# Patient Record
Sex: Female | Born: 1990 | Race: Black or African American | Hispanic: No | Marital: Single | Smoking: Never smoker
Health system: Southern US, Community
[De-identification: ages and names within clinical notes are randomized; demographics above are authoritative.]

## PROBLEM LIST (undated history)

## (undated) HISTORY — PX: TUBAL LIGATION: SHX77

---

## 2018-08-21 NOTE — Congregational Nurse Program (Signed)
Congregational Nurse Program Note  Date of Encounter: 08/21/2018  Past Medical History: No past medical history on file.  Encounter Details: [facility] No complaints. B P  113/[address] P [address] [place], [place] [phone] [facility]

## 2018-12-16 ENCOUNTER — Emergency Department (HOSPITAL_COMMUNITY): Payer: 59

## 2018-12-16 ENCOUNTER — Other Ambulatory Visit: Payer: Self-pay

## 2018-12-16 ENCOUNTER — Emergency Department (HOSPITAL_COMMUNITY)
Admission: EM | Admit: 2018-12-16 | Discharge: 2018-12-16 | Disposition: A | Discharge status: Home / Self Care | Payer: 59 | Attending: Emergency Medicine | Admitting: Emergency Medicine

## 2018-12-16 ENCOUNTER — Encounter (HOSPITAL_COMMUNITY): Payer: Self-pay

## 2018-12-16 DIAGNOSIS — J189 Pneumonia, unspecified organism: Secondary | ICD-10-CM | POA: Diagnosis not present

## 2018-12-16 DIAGNOSIS — J181 Lobar pneumonia, unspecified organism: Secondary | ICD-10-CM

## 2018-12-16 DIAGNOSIS — R05 Cough: Secondary | ICD-10-CM | POA: Diagnosis present

## 2018-12-16 MED ORDER — CEPHALEXIN 500 MG PO CAPS: 500.0000 mg | ORAL_CAPSULE | Freq: Four times a day (QID) | ORAL | 0 refills | Status: DC

## 2018-12-16 MED ORDER — IBUPROFEN 800 MG PO TABS
800.0000 mg | ORAL_TABLET | Freq: Once | ORAL | Status: AC
  Administered 2018-12-16: 800 mg via ORAL
  Filled 2018-12-16: qty 1

## 2018-12-16 MED ORDER — ONDANSETRON HCL 4 MG PO TABS
4.0000 mg | ORAL_TABLET | Freq: Once | ORAL | Status: AC
  Administered 2018-12-16: 4 mg via ORAL
  Filled 2018-12-16: qty 1

## 2018-12-16 MED ORDER — HYDROCOD POLST-CPM POLST ER 10-8 MG/5ML PO SUER
5.0000 mL | Freq: Once | ORAL | Status: AC
  Administered 2018-12-16: 5 mL via ORAL
  Filled 2018-12-16: qty 5

## 2018-12-16 MED ORDER — ACETAMINOPHEN 325 MG PO TABS
650.0000 mg | ORAL_TABLET | Freq: Once | ORAL | Status: AC
  Administered 2018-12-16: 650 mg via ORAL

## 2018-12-16 MED ORDER — CEPHALEXIN 500 MG PO CAPS
500.0000 mg | ORAL_CAPSULE | Freq: Once | ORAL | Status: AC
  Administered 2018-12-16: 500 mg via ORAL
  Filled 2018-12-16: qty 1

## 2018-12-16 MED ORDER — HYDROCODONE-HOMATROPINE 5-1.5 MG/5ML PO SYRP: 5.0000 mL | ORAL_SOLUTION | Freq: Four times a day (QID) | ORAL | 0 refills | Status: DC | PRN

## 2018-12-16 MED ORDER — ALBUTEROL SULFATE HFA 108 (90 BASE) MCG/ACT IN AERS
2.0000 | INHALATION_SPRAY | Freq: Once | RESPIRATORY_TRACT | Status: AC
  Administered 2018-12-16: 2 via RESPIRATORY_TRACT
  Filled 2018-12-16 (×2): qty 6.7

## 2018-12-16 MED ORDER — AZITHROMYCIN 250 MG PO TABS
500.0000 mg | ORAL_TABLET | Freq: Once | ORAL | Status: AC
  Administered 2018-12-16: 500 mg via ORAL
  Filled 2018-12-16: qty 2

## 2018-12-16 MED ORDER — AZITHROMYCIN 250 MG PO TABS: ORAL_TABLET | ORAL | 0 refills | Status: DC

## 2018-12-16 MED ORDER — ACETAMINOPHEN 325 MG PO TABS
ORAL_TABLET | ORAL | Status: AC
  Filled 2018-12-16: qty 2

## 2018-12-16 NOTE — ED Triage Notes (Signed)
Pt c/o cough, congestion, headache, chills, headache since Friday.  Has been taking tylenol pm prn and alkaseltzer plus.

## 2018-12-16 NOTE — ED Provider Notes (Signed)
[facility]ENCY [facility] Provider Note   CSN: [id number] Arrival date & time: 12/16/18  1805     History   Chief Complaint Chief Complaint  Patient presents with  . Cough  . Chills    HPI Donna HeinzVeronica Horton is a 27 y.o. female.  The history is provided by the patient.  Cough  This is a new problem. The current episode started more than 2 days ago. The problem occurs constantly. The problem has been gradually worsening. The cough is productive of sputum. The maximum temperature recorded prior to her arrival was 101 to 101.9 F. The fever has been present for 1 to 2 days. Associated symptoms include chills, myalgias and shortness of breath. Pertinent negatives include no chest pain and no wheezing. Treatments tried: alka-seltzer. The treatment provided no relief. Her past medical history is significant for bronchitis.    History reviewed. No pertinent past medical history.  There are no active problems to display for this patient.   History reviewed. No pertinent surgical history.   OB History   No obstetric history on file.      Home Medications    Prior to Admission medications   Not on File    Family History No family history on file.  Social History Social History   Tobacco Use  . Smoking status: Never Smoker  . Smokeless tobacco: Never Used  Substance Use Topics  . Alcohol use: Never    Frequency: Never  . Drug use: Never     Allergies   Patient has no known allergies.   Review of Systems Review of Systems  Constitutional: Positive for chills. Negative for activity change.       All ROS Neg except as noted in HPI  HENT: Positive for congestion. Negative for nosebleeds.   Eyes: Negative for photophobia and discharge.  Respiratory: Positive for cough and shortness of breath. Negative for wheezing.   Cardiovascular: Negative for chest pain and palpitations.  Gastrointestinal: Negative for abdominal pain and blood in stool.  Genitourinary:  Negative for dysuria, frequency and hematuria.  Musculoskeletal: Positive for myalgias. Negative for arthralgias, back pain and neck pain.  Skin: Negative.   Neurological: Negative for dizziness, seizures and speech difficulty.  Psychiatric/Behavioral: Negative for confusion and hallucinations.     Physical Exam Updated Vital Signs BP 131/77 (BP Location: Right Arm)   Pulse (!) 132   Temp (!) 101.9 F (38.8 C) (Oral)   Resp 18   Ht 5\' 2"  (1.575 m)   Wt 99.8 kg   LMP 11/28/2018   SpO2 94%   BMI 40.24 kg/m   Physical Exam Vitals signs and nursing note reviewed.  Constitutional:      Appearance: She is well-developed. She is not toxic-appearing.  HENT:     Head: Normocephalic.     Right Ear: Tympanic membrane and external ear normal.     Left Ear: Tympanic membrane and external ear normal.     Nose: Congestion present.  Eyes:     General: Lids are normal.     Pupils: Pupils are equal, round, and reactive to light.  Neck:     Musculoskeletal: Normal range of motion and neck supple.     Vascular: No carotid bruit.  Cardiovascular:     Rate and Rhythm: Normal rate and regular rhythm.     Pulses: Normal pulses.     Heart sounds: Normal heart sounds.  Pulmonary:     Effort: No respiratory distress.     Breath  sounds: Rhonchi present.  Abdominal:     General: Bowel sounds are normal.     Palpations: Abdomen is soft.     Tenderness: There is no abdominal tenderness. There is no guarding.  Musculoskeletal: Normal range of motion.  Lymphadenopathy:     Head:     Right side of head: No submandibular adenopathy.     Left side of head: No submandibular adenopathy.     Cervical: No cervical adenopathy.  Skin:    General: Skin is warm and dry.  Neurological:     Mental Status: She is alert and oriented to person, place, and time.     Cranial Nerves: No cranial nerve deficit.     Sensory: No sensory deficit.  Psychiatric:        Speech: Speech normal.      ED  Treatments / Results  Labs (all labs ordered are listed, but only abnormal results are displayed) Labs Reviewed - No data to display  EKG None  Radiology Dg Chest 2 View  Result Date: 12/16/2018 CLINICAL DATA:  Cough and fever. EXAM: CHEST - 2 VIEW COMPARISON:  None FINDINGS: There is a consolidative infiltrate in the right upper lobe adjacent to the major fissure. The lungs are otherwise clear. No effusions. Heart size and vascularity are normal. Bones are normal. IMPRESSION: Right upper lobe pneumonia. Electronically Signed   By: Francene BoyersJames  Maxwell M.D.   On: 12/16/2018 19:01    Procedures Procedures (including critical care time)  Medications Ordered in ED Medications  acetaminophen (TYLENOL) 325 MG tablet (has no administration in time range)  acetaminophen (TYLENOL) tablet 650 mg (650 mg Oral Given 12/16/18 1815)     Initial Impression / Assessment and Plan / ED Course  I have reviewed the triage vital signs and the nursing notes.  Pertinent labs & imaging results that were available during my care of the patient were reviewed by me and considered in my medical decision making (see chart for details).       Final Clinical Impressions(s) / ED Diagnoses MDM  Temperature elevated at 101.9 on admission to the emergency department.  Patient presents with about 3 days of cough, congestion, headache and body aches.  Chest x-ray shows a right upper lobe pneumonia.  The patient was treated in the emergency department with Zithromax and Rocephin.  Patient was also given an albuterol inhaler to use.  Patient advised to increase fluids, use her mask until symptoms have resolved.  She will use Tylenol extra strength for fever and aching.  Patient also given medication for cough.  Patient will use Zithromax daily.  I have asked the patient to see her primary physician or return to the emergency department for recheck.  The patient is in agreement with this plan.   Final diagnoses:    Community acquired pneumonia of right upper lobe of lung [facility](HCC)    ED Discharge Orders         Ordered    azithromycin (ZITHROMAX) 250 MG tablet     12/16/18 2020    HYDROcodone-homatropine (HYCODAN) 5-1.5 MG/5ML syrup  Every 6 hours PRN     12/16/18 2020    cephALEXin (KEFLEX) 500 MG capsule  4 times daily     12/16/18 2020           Ivery QualeBryant, Avyonna Wagoner, PA-C 12/17/18 Elberta [id number]    Kohut, Stephen, MD 12/19/18 [id number]

## 2018-12-16 NOTE — Discharge Instructions (Signed)
Your x-ray is consistent with a right lung pneumonia.  Please use Tylenol every 4 hours, and/or ibuprofen every 6 hours to maintain a stable temperature.  Please increase fluids including water, juices, Kool-Aid, popsicles, etc.  It is important that you use your mask until symptoms have resolved.  Please have everyone in the home wash hands frequently.  Please use 2 puffs of albuterol every 4 hours.  Use Keflex and ibuprofen 600mg  with breakfast, lunch, dinner, and at bedtime.  Use Zithromax 1 tablet daily.  Use Hycodan every 6 hours as needed for cough. Please see your primary physician in about 4 to 6 days for follow-up and recheck.  Return to the emergency department if any changes in your condition, worsening of your condition, problems, or concerns.

## 2019-01-28 MED ORDER — LIDOCAINE HCL (PF) 1 % IJ SOLN
INTRAMUSCULAR | Status: AC
  Filled 2019-01-28: qty 5

## 2019-01-28 MED ORDER — LIDOCAINE-EPINEPHRINE (PF) 1 %-1:200000 IJ SOLN
INTRAMUSCULAR | Status: AC
  Filled 2019-01-28: qty 30

## 2019-01-28 MED ORDER — SODIUM BICARBONATE 4 % IV SOLN
INTRAVENOUS | Status: AC
  Filled 2019-01-28: qty 5

## 2020-04-27 IMAGING — DX DG CHEST 2V
2 series · 2 of 2 positions shown · non-contrast
Comparison: None

CLINICAL DATA: Cough and fever.

EXAM:
CHEST - 2 VIEW

[chest pa]
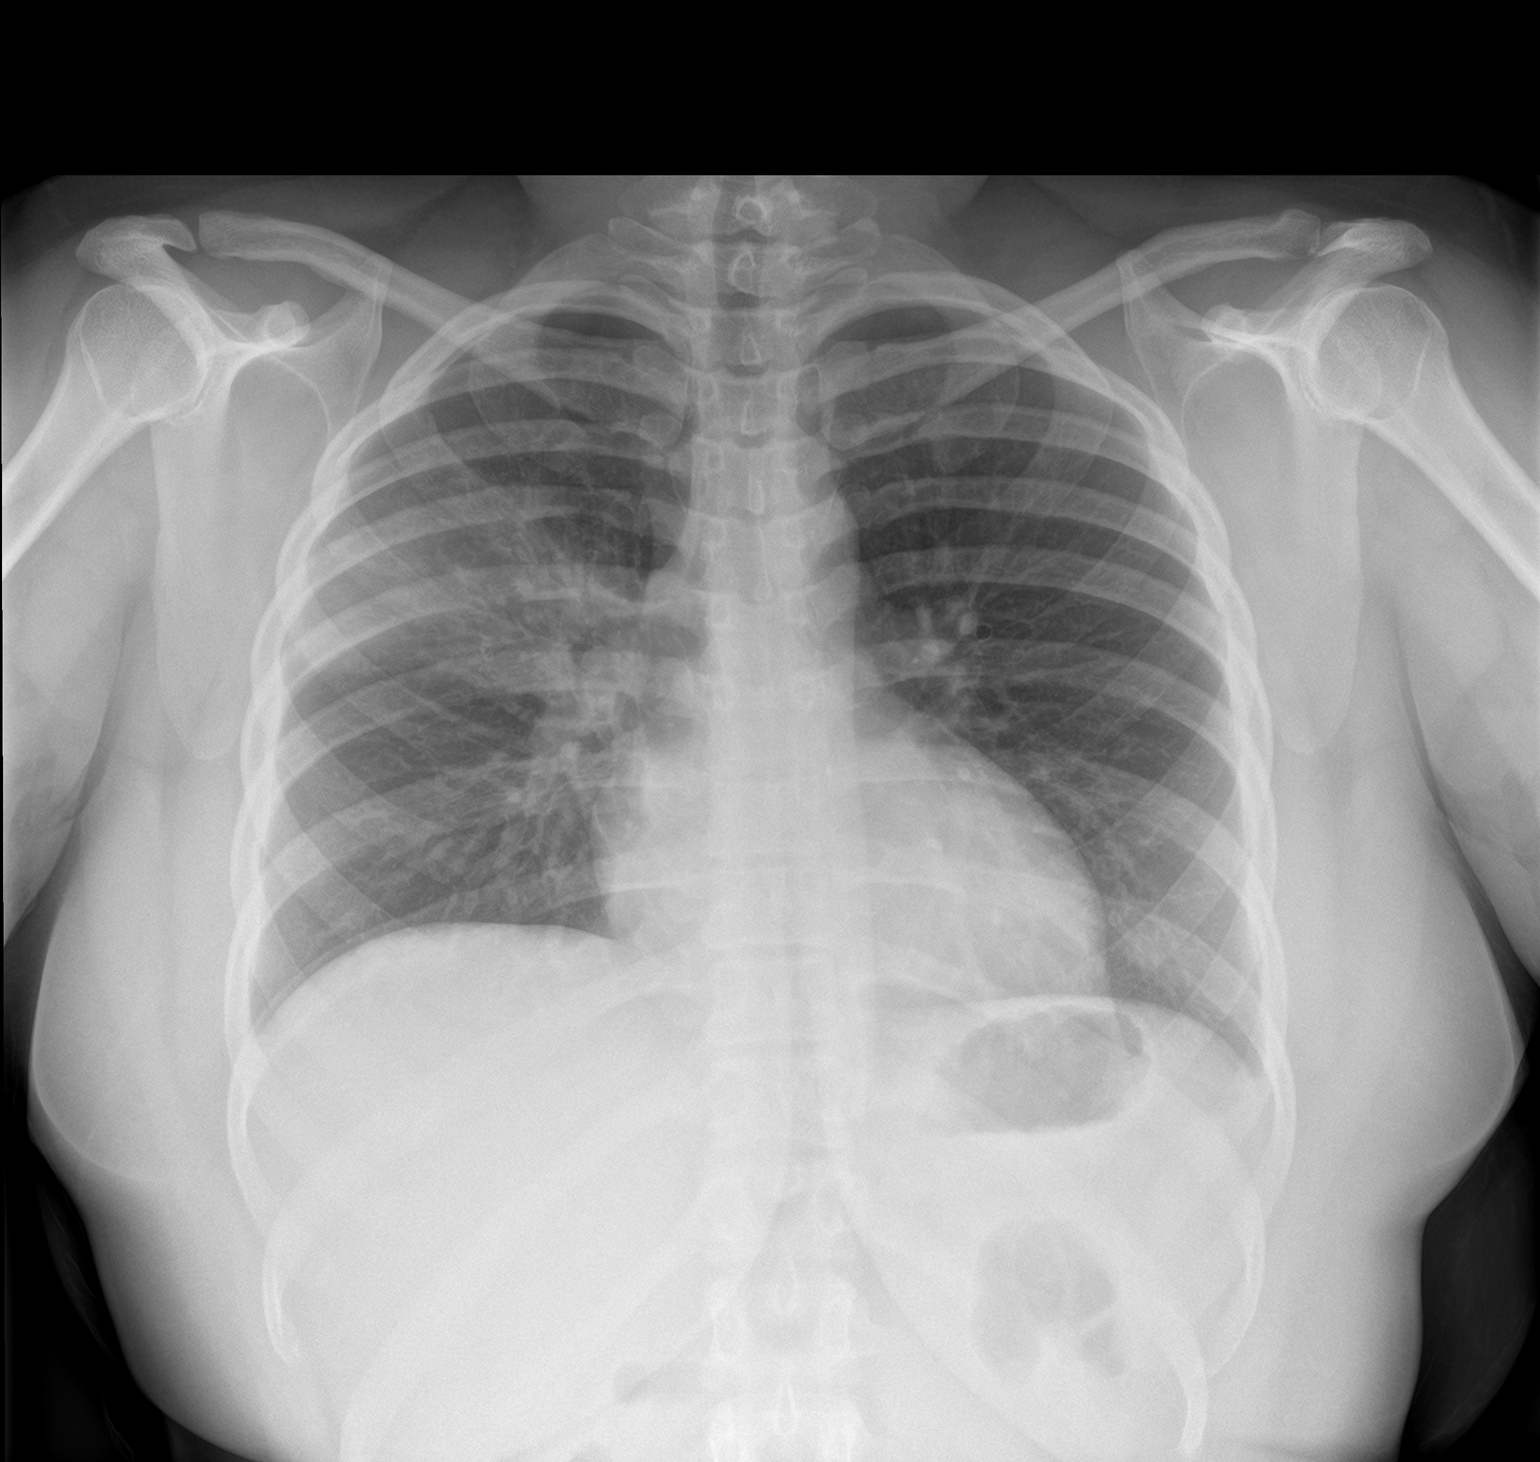

[chest lat]
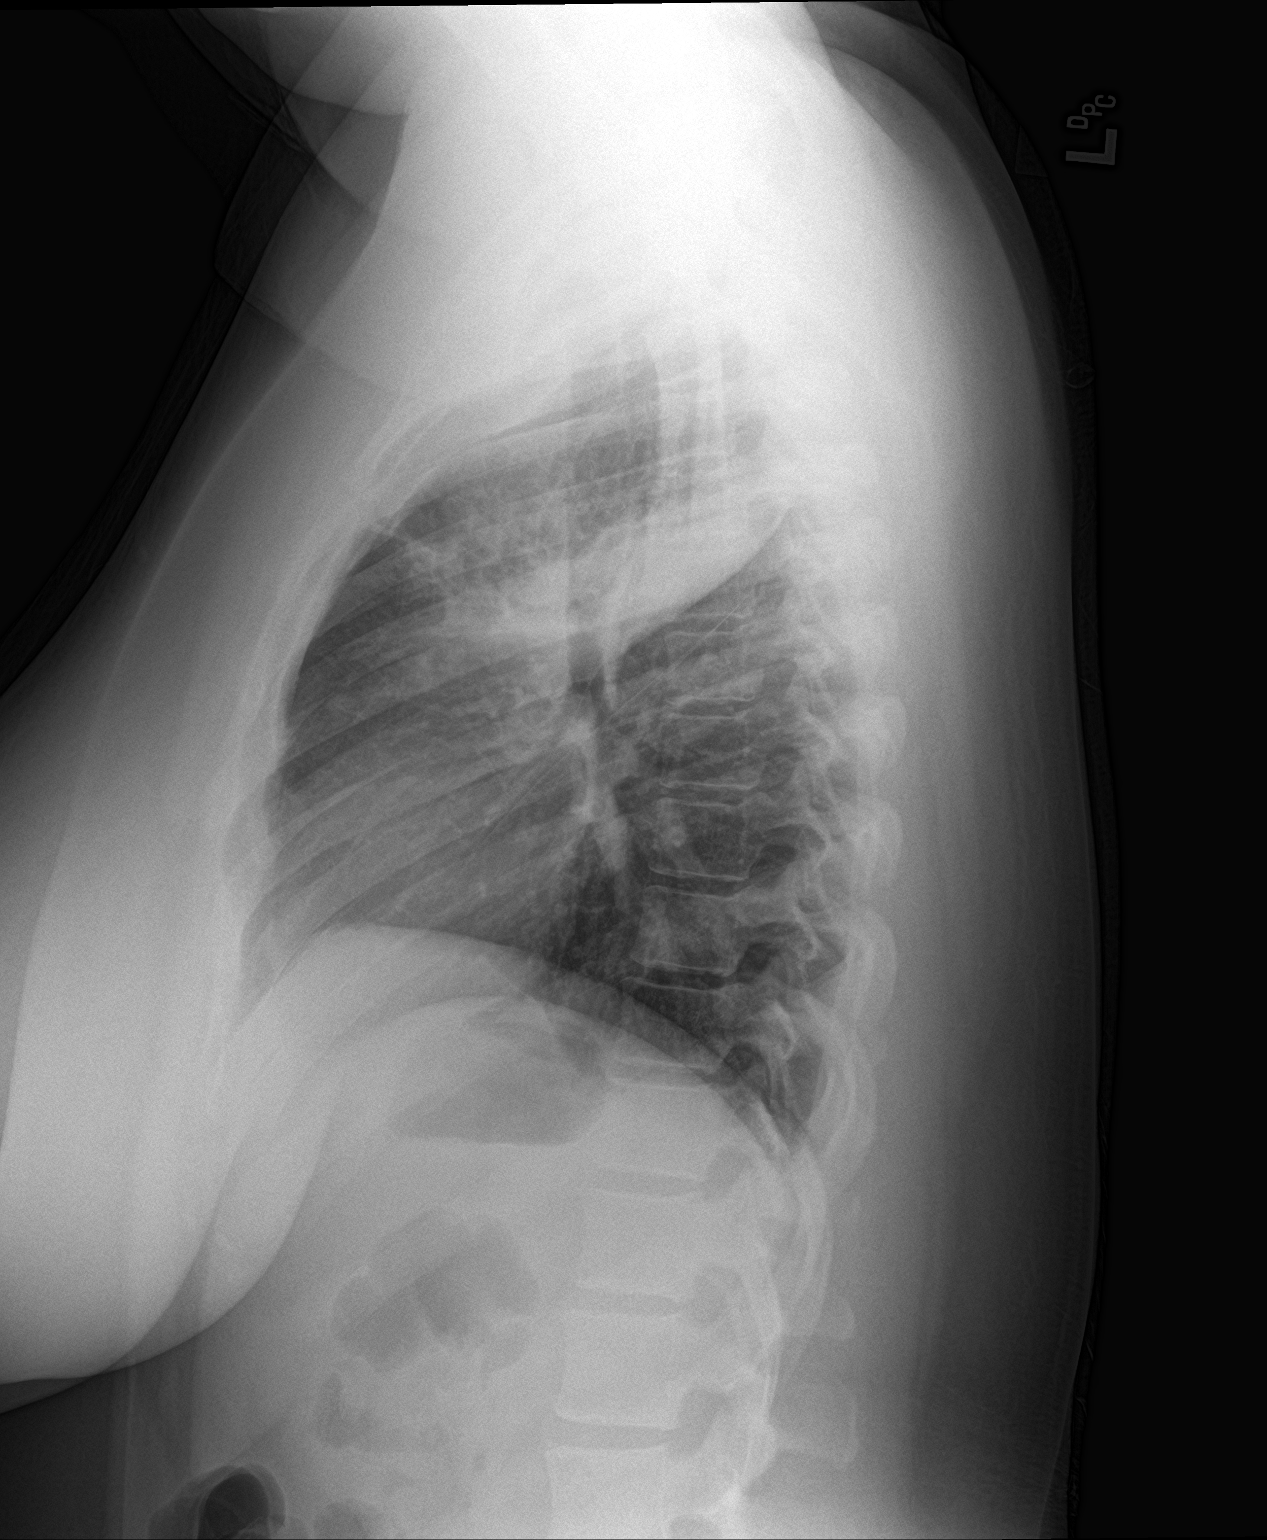

[2 of 2 positions shown; findings below may reference images not displayed]

FINDINGS: There is a consolidative infiltrate in the right upper lobe adjacent
to the major fissure. The lungs are otherwise clear. No effusions.
Heart size and vascularity are normal. Bones are normal.
IMPRESSION: Right upper lobe pneumonia.

## 2020-06-14 ENCOUNTER — Encounter (HOSPITAL_COMMUNITY): Payer: Self-pay | Admitting: *Deleted

## 2020-06-14 ENCOUNTER — Other Ambulatory Visit: Payer: Self-pay

## 2020-06-14 ENCOUNTER — Emergency Department (HOSPITAL_COMMUNITY)
Admission: EM | Admit: 2020-06-14 | Discharge: 2020-06-14 | Disposition: A | Discharge status: Home / Self Care | Payer: Medicaid Other | Attending: Emergency Medicine | Admitting: Emergency Medicine

## 2020-06-14 DIAGNOSIS — K047 Periapical abscess without sinus: Secondary | ICD-10-CM | POA: Diagnosis not present

## 2020-06-14 MED ORDER — CLINDAMYCIN HCL 150 MG PO CAPS: 450.0000 mg | ORAL_CAPSULE | Freq: Once | ORAL | Status: DC

## 2020-06-14 MED ORDER — CLINDAMYCIN HCL 150 MG PO CAPS
300.0000 mg | ORAL_CAPSULE | Freq: Once | ORAL | Status: AC
  Administered 2020-06-14: 300 mg via ORAL
  Filled 2020-06-14: qty 2

## 2020-06-14 MED ORDER — CLINDAMYCIN HCL 150 MG PO CAPS: 300.0000 mg | ORAL_CAPSULE | Freq: Four times a day (QID) | ORAL | 0 refills | Status: AC

## 2020-06-14 NOTE — ED Triage Notes (Signed)
Pt has pain and swelling to right lower jaw; pt states she went to her dentist x 2 weeks and was told she needed to have a tooth pulled, they made the appointment for the end of July and gave the patient an antibiotic and ibuprofen; pt states she woke up last Wednesday with jaw swelling that has gotten progressively worse; pt states she finished her antibiotic yesterday; pt called her dentist this am and they instructed her to come to the ED

## 2020-06-14 NOTE — Discharge Instructions (Addendum)
You may follow up with Dr. Sonda Rumble: [phone]  [address] [place], [place] [address]  Return to the ER if your symptoms not improve in 3 to 4 days, worsening fevers, chills, drooling, difficulty swallowing, etc.

## 2020-06-14 NOTE — ED Provider Notes (Signed)
Patient with progressive right lower jaw swelling, this is on the buccal surface, there is no sublingual swelling, no signs of trismus torticollis or Ludwick's angina, no fever, vital signs are unremarkable except for mild tachycardia of 104.  Patient advised of the indications for return, will be started on clindamycin, has follow-up in over 3 weeks at a dentist office, will need closer follow-up, will give alternative local dentistry contact information.  Patient agreeable  Medical screening examination/treatment/procedure(s) were conducted as a shared visit with non-physician practitioner(s) and myself.  I personally evaluated the patient during the encounter.  Clinical Impression:   Final diagnoses:  Dental abscess         Eber Hong, MD 06/15/20 [phone]

## 2020-06-14 NOTE — ED Provider Notes (Signed)
[facility] EMERGENCY DEPARTMENT Provider Note   CSN: [id number] Arrival date & time: 06/14/20  1305     History Chief Complaint  Patient presents with  . Oral Swelling    Donna Horton is a 29 y.o. female.  HPI 29 year old female with no significant medical history presents to the ER with right cheek/jaw swelling since Wednesday.  Patient states that she was seen by a dentist approximately 2 weeks ago, and was told that she needs to have a tooth pulled, was scheduled for tooth extraction at the end of July.  She was started on a 2-week course of amoxicillin.  Patient reports compliance with this, however on Wednesday, 06/09/2020 she started noticing swelling to her right lower cheek.  She denies any pain, difficulty swallowing, trismus, drooling, fevers or chills, voice change.  Swelling has progressively gotten worse.  She states this is not painful.  She was also given chlorhexidine mouthwash and ibuprofen which she has also been compliant with.  She has been tolerating p.o. well, no nausea or vomiting.  She called her dentist, and states that they told her to come to the ER.    History reviewed. No pertinent past medical history.  There are no problems to display for this patient.   History reviewed. No pertinent surgical history.   OB History   No obstetric history on file.     History reviewed. No pertinent family history.  Social History   Tobacco Use  . Smoking status: Never Smoker  . Smokeless tobacco: Never Used  Vaping Use  . Vaping Use: Never used  Substance Use Topics  . Alcohol use: Never  . Drug use: Never    Home Medications Prior to Admission medications   Medication Sig Start Date End Date Taking? Authorizing Provider  azithromycin (ZITHROMAX) 250 MG tablet 1 po daily 12/16/18   Ivery Quale, PA-C  cephALEXin (KEFLEX) 500 MG capsule Take 1 capsule (500 mg total) by mouth 4 (four) times daily. 12/16/18   Ivery Quale, PA-C  clindamycin  (CLEOCIN) 150 MG capsule Take 2 capsules (300 mg total) by mouth 4 (four) times daily for 10 days. 06/14/20 06/24/20  Mare Ferrari, PA-C  HYDROcodone-homatropine (HYCODAN) 5-1.5 MG/5ML syrup Take 5 mLs by mouth every 6 (six) hours as needed. 12/16/18   Ivery Quale, PA-C    Allergies    Patient has no known allergies.  Review of Systems   Review of Systems  Constitutional: Negative for chills and fever.  HENT: Positive for dental problem, facial swelling and mouth sores. Negative for drooling, ear pain, rhinorrhea, sinus pressure, tinnitus, trouble swallowing and voice change.   Eyes: Negative for pain and discharge.  Respiratory: Negative for shortness of breath.   Cardiovascular: Negative for chest pain.  Gastrointestinal: Negative for abdominal pain, nausea and vomiting.    Physical Exam Updated Vital Signs BP 116/77   Pulse 95   Temp 98.9 F (37.2 C) (Oral)   Resp 18   Ht 5\' 2"  (1.575 m)   Wt 103 kg   LMP 06/11/2020   SpO2 99%   BMI 41.52 kg/m   Physical Exam Vitals and nursing note reviewed.  Constitutional:      General: She is not in acute distress.    Appearance: Normal appearance. She is well-developed. She is not ill-appearing or toxic-appearing.  HENT:     Head: Normocephalic and atraumatic.     Nose: Nose normal.     Mouth/Throat:     Mouth: Mucous membranes are  moist.     Pharynx: Oropharynx is clear. No oropharyngeal exudate or posterior oropharyngeal erythema.     Comments: Oropharynx non erythematous without exudates, uvula midline, no unilateral tonsillar swelling, tongue normal size and midline, no sublingual/submandibular swellimg, tolerating secretions well.  Moderate swelling to the right lower cheek/ jaw.  No significant palpable fluctuance, no evidence of drainage.  Visible right lower molar with crown, with visible dental caries.  1 small sore on the lower gumline on the right side.  No trismus.    Eyes:     Conjunctiva/sclera: Conjunctivae  normal.     Pupils: Pupils are equal, round, and reactive to light.  Cardiovascular:     Rate and Rhythm: Normal rate and regular rhythm.     Pulses: Normal pulses.     Heart sounds: Normal heart sounds. No murmur heard.   Pulmonary:     Effort: Pulmonary effort is normal. No respiratory distress.     Breath sounds: Normal breath sounds.  Abdominal:     General: Abdomen is flat.     Palpations: Abdomen is soft.     Tenderness: There is no abdominal tenderness.  Musculoskeletal:        General: No tenderness, deformity or signs of injury.     Cervical back: Normal range of motion and neck supple. No tenderness.  Skin:    General: Skin is warm and dry.  Neurological:     General: No focal deficit present.     Mental Status: She is alert and oriented to person, place, and time.  Psychiatric:        Mood and Affect: Mood normal.        Behavior: Behavior normal.     ED Results / Procedures / Treatments   Labs (all labs ordered are listed, but only abnormal results are displayed) Labs Reviewed - No data to display  EKG None  Radiology No results found.  Procedures Procedures (including critical care time)  Medications Ordered in ED Medications  clindamycin (CLEOCIN) capsule 300 mg (300 mg Oral Given 06/14/20 1547)    ED Course  I have reviewed the triage vital signs and the nursing notes.  Pertinent labs & imaging results that were available during my care of the patient were reviewed by me and considered in my medical decision making (see chart for details).    MDM Rules/Calculators/A&P                          Patient with swelling to the right lower jaw since Wednesday after being put on amoxicillin by her dentist.  On presentation, she is overall well-appearing, afebrile, tolerating secretions well, nontoxic-appearing.  Vitals overall reassuring.  Physical exam with evidence of swollen right lower cheek/jaw.  No evidence of Ludwig's, peritonsillar abscess,  retropharyngeal abscess, strep.  Abscess not overly fluctuant, no indication for drainage.  Will switch amoxicillin to clindamycin, very strict return precautions given to the patient.  Patient was seen and evaluated by Dr. Sabra Heck who instructed the patient to return to the ER if her symptoms not improve in 3 to 4 days.  Encouraged follow-up with Dr. Rayetta Pigg for a sooner appointment.  Patient voices understanding is agreeable to this plan.  At this stage in the ED course, the patient is medically screened and is stable for discharge.  Final Clinical Impression(s) / ED Diagnoses Final diagnoses:  Dental abscess    Rx / DC Orders ED Discharge Orders  Ordered    clindamycin (CLEOCIN) 150 MG capsule  4 times daily     Discontinue  Reprint     06/14/20 1525           Leone Brand 06/14/20 1557    Eber Hong, MD 06/15/20 [phone]

## 2021-01-05 ENCOUNTER — Other Ambulatory Visit: Payer: Self-pay

## 2021-01-05 ENCOUNTER — Emergency Department (HOSPITAL_COMMUNITY)
Admission: EM | Admit: 2021-01-05 | Discharge: 2021-01-05 | Disposition: A | Discharge status: Home / Self Care | Payer: Medicaid Other | Attending: Emergency Medicine | Admitting: Emergency Medicine

## 2021-01-05 ENCOUNTER — Encounter (HOSPITAL_COMMUNITY): Payer: Self-pay

## 2021-01-05 DIAGNOSIS — U071 COVID-19: Secondary | ICD-10-CM | POA: Insufficient documentation

## 2021-01-05 DIAGNOSIS — Z20822 Contact with and (suspected) exposure to covid-19: Secondary | ICD-10-CM

## 2021-01-05 DIAGNOSIS — J069 Acute upper respiratory infection, unspecified: Secondary | ICD-10-CM

## 2021-01-05 DIAGNOSIS — R519 Headache, unspecified: Secondary | ICD-10-CM | POA: Diagnosis present

## 2021-01-05 LAB — SARS CORONAVIRUS 2 (TAT 6-24 HRS): SARS Coronavirus 2: POSITIVE — AB

## 2021-01-05 NOTE — Discharge Instructions (Addendum)
You were tested for COVID-19 today, make sure to quarantine until you receive the results.  You may follow-up on these results via the MyChart app, there is instructions on your discharge paperwork on how to download this.  If this is positive, please make sure to quarantine additional 7 to 10 days. You may take ibuprofen/Tylenol for body aches and fevers, drink plenty of fluids, over-the-counter cold and flu medications. You may also buy an over-the-counter pulse oximeter which can be found at your local pharmacy. If your oxygen saturation drops below 90%, please make sure to return to the ER. Return to the ER for any worsening shortness of breath.

## 2021-01-05 NOTE — ED Triage Notes (Signed)
Pt presents to ED with complaints of headache, sore throat, runny nose, congestion since Monday. Pt unvaccinated for Covid.

## 2021-01-05 NOTE — ED Provider Notes (Signed)
[facility] EMERGENCY DEPARTMENT Provider Note   CSN: [id number] Arrival date & time: 01/05/21  1059     History Chief Complaint  Patient presents with  . Headache    Donna Horton is a 30 y.o. female.  HPI 30 year old female with no significant medical history presents to the ER with complaints of dry cough, headache, nasal congestion, body aches x2 days.  She is not vaccinated.  No known sick contacts.  Has been taking Alka-Seltzer for her symptoms.  Denies any chest pain or shortness of breath.  Denies any vision changes, neck stiffness, drooling, inability to swallow.  Denies any abdominal pain, nausea, vomiting or dysuria, hematuria    History reviewed. No pertinent past medical history.  There are no problems to display for this patient.   History reviewed. No pertinent surgical history.   OB History   No obstetric history on file.     No family history on file.  Social History   Tobacco Use  . Smoking status: Never Smoker  . Smokeless tobacco: Never Used  Vaping Use  . Vaping Use: Never used  Substance Use Topics  . Alcohol use: Never  . Drug use: Never    Home Medications Prior to Admission medications   Medication Sig Start Date End Date Taking? Authorizing Provider  azithromycin (ZITHROMAX) 250 MG tablet 1 po daily 12/16/18   Ivery Quale, PA-C  cephALEXin (KEFLEX) 500 MG capsule Take 1 capsule (500 mg total) by mouth 4 (four) times daily. 12/16/18   Ivery Quale, PA-C  HYDROcodone-homatropine [facility]) 5-1.5 MG/5ML syrup Take 5 mLs by mouth every 6 (six) hours as needed. 12/16/18   Ivery Quale, PA-C    Allergies    Patient has no known allergies.  Review of Systems   Review of Systems  Constitutional: Positive for activity change, appetite change, chills, fatigue and fever.  HENT: Positive for congestion and sore throat. Negative for facial swelling, mouth sores, trouble swallowing and voice change.   Respiratory: Positive for cough.  Negative for shortness of breath.   Cardiovascular: Negative for chest pain.  Gastrointestinal: Negative for abdominal pain, diarrhea, nausea and vomiting.  Genitourinary: Negative for dysuria.  Neurological: Positive for weakness and headaches. Negative for numbness.    Physical Exam Updated Vital Signs BP 121/81 (BP Location: Right Arm)   Pulse 99   Temp 98.5 F (36.9 C) (Oral)   Resp 18   Ht 5\' 2"  (1.575 m)   Wt 103 kg   LMP 12/05/2020   SpO2 99%   BMI 41.52 kg/m   Physical Exam Vitals and nursing note reviewed.  Constitutional:      General: She is not in acute distress.    Appearance: She is well-developed and well-nourished. She is not ill-appearing or diaphoretic.  HENT:     Head: Normocephalic and atraumatic.     Mouth/Throat:     Comments: None erythematous, nonedematous oropharynx, uvula midline, no sublingual swelling, tolerating secretions, no tripoding Eyes:     Conjunctiva/sclera: Conjunctivae normal.  Cardiovascular:     Rate and Rhythm: Normal rate and regular rhythm.     Heart sounds: No murmur heard.   Pulmonary:     Effort: Pulmonary effort is normal. No respiratory distress.     Breath sounds: Normal breath sounds.  Abdominal:     Palpations: Abdomen is soft.     Tenderness: There is no abdominal tenderness.  Musculoskeletal:        General: No edema. Normal range of motion.  Cervical back: Neck supple.  Skin:    General: Skin is warm and dry.  Neurological:     Mental Status: She is alert.  Psychiatric:        Mood and Affect: Mood and affect normal.     ED Results / Procedures / Treatments   Labs (all labs ordered are listed, but only abnormal results are displayed) Labs Reviewed  SARS CORONAVIRUS 2 (TAT 6-24 HRS)    EKG None  Radiology No results found.  Procedures Procedures (including critical care time)  Medications Ordered in ED Medications - No data to display  ED Course  I have reviewed the triage vital signs  and the nursing notes.  Pertinent labs & imaging results that were available during my care of the patient were reviewed by me and considered in my medical decision making (see chart for details).    MDM Rules/Calculators/A&P                          30 year old female with viral upper respiratory symptoms, consider COVID-19.   Oropharynx not erythematous, low suspicion for strep given reported cough.  Lung sounds clear.  Very well-appearing, low suspicion for pneumonia, no meningeal signs, no evidence of RPA, PTA, Ludwick's.  Low suspicion for PE, ACS.  Vitals reassuring.  Will swab for COVID, patient instructed to follow-up on the results via the MyChart app.  Educated on Electrical engineer.  And supportive measures.  Return precautions discussed.  She voiced understanding and is agreeable. Final Clinical Impression(s) / ED Diagnoses Final diagnoses:  Viral URI with cough  Encounter for laboratory testing for COVID-19 virus    Rx / DC Orders ED Discharge Orders    None       Leone Brand 01/05/21 1232    Cheryll Cockayne, MD 01/10/21 2344

## 2021-01-05 NOTE — ED Notes (Signed)
C/o COVID symptoms; headache, sore throat, chills and fever on 17th.

## 2021-11-03 ENCOUNTER — Encounter: Payer: Self-pay | Admitting: Nurse Practitioner

## 2021-11-03 ENCOUNTER — Ambulatory Visit (INDEPENDENT_AMBULATORY_CARE_PROVIDER_SITE_OTHER): Payer: Medicaid Other | Admitting: Nurse Practitioner

## 2021-11-03 ENCOUNTER — Other Ambulatory Visit: Payer: Self-pay

## 2021-11-03 VITALS — BP 106/70 | HR 107 | Ht 62.0 in | Wt 226.1 lb

## 2021-11-03 DIAGNOSIS — Z6841 Body Mass Index (BMI) 40.0 and over, adult: Secondary | ICD-10-CM | POA: Diagnosis not present

## 2021-11-03 DIAGNOSIS — Z23 Encounter for immunization: Secondary | ICD-10-CM | POA: Diagnosis not present

## 2021-11-03 DIAGNOSIS — Z0000 Encounter for general adult medical examination without abnormal findings: Secondary | ICD-10-CM

## 2021-11-03 DIAGNOSIS — E669 Obesity, unspecified: Secondary | ICD-10-CM | POA: Insufficient documentation

## 2021-11-03 NOTE — Assessment & Plan Note (Signed)
Maintain a healthy weight.  Every week do at least 150 minutes of moderate physical activity such as brisk walking, or 75 minutes of vigorous activities like jogging    Eat high protein,fruits and vegetables. Lower carb meals to control hunger and appetite. Avoid or eat fewer carbs like bread, pasta, rice, deserts, sugary beverages and juice.  Choose carbs that are higher in fiber and lower in added sugar fr example say yes to beans and sweet potatoes, say no to sugary drinks and chips.

## 2021-11-03 NOTE — Patient Instructions (Signed)
Please come back here for your labs 3-5 days before your next visit. Plan for PAP smear next visit.      It is important that you exercise regularly at least 30 minutes 5 times a week.  Think about what you will eat, plan ahead. Choose " clean, green, fresh or frozen" over canned, processed or packaged foods which are more sugary, salty and fatty. 70 to 75% of food eaten should be vegetables and fruit. Three meals at set times with snacks allowed between meals, but they must be fruit or vegetables. Aim to eat over a 12 hour period , example 7 am to 7 pm, and STOP after  your last meal of the day. Drink water,generally about 64 ounces per day, no other drink is as healthy. Fruit juice is best enjoyed in a healthy way, by EATING the fruit.  Thanks for choosing [facility], we consider it a privelige to serve you.

## 2021-11-03 NOTE — Addendum Note (Signed)
Addended by: Donell Beers on: 11/03/2021 09:04 PM   Modules accepted: Level of Service

## 2021-11-03 NOTE — Assessment & Plan Note (Signed)
Routine labs ordered.  Annual exam as documented.  Counseling done include healthy lifestyle involving committing to 150 minutes of exercise per week, heart healthy diet, and attaining healthy weight. Regular use of seat belt and home safety were also discussed . Changes in health habits are decided on by patient Immunization and cancer screening  needs are specifically addressed at this visit. Will do PAP smear during next visit.

## 2021-11-03 NOTE — Progress Notes (Signed)
New Patient Office Visit  Subjective:  Patient ID: Donna Horton, female    DOB: 03-Oct-1991  Age: 30 y.o. MRN: [id number]  CC:  Chief Complaint  Patient presents with   New Patient (Initial Visit)    New pt. Previously was going to health department    HPI Donna Horton presents to establish care. PT is a healthy looking young female. She has no significant health history. She stated that she was treated for herpes years ago and was taking medications for it. She denies any symptoms at this time.   Goes to [facility] opthalmology for her eyeexam, next eye exam is in Dec.   She has not have any physical or labs done in a while.   PAP smear next visit. Flu vac Given today.  Importance of getting required vaccines discussed.   History reviewed. No pertinent past medical history.  History reviewed. No pertinent surgical history.  Family History  Problem Relation Age of Onset   Heart murmur Daughter    Diabetes Maternal Aunt    Diabetes Maternal Grandfather     Social History   Socioeconomic History   Marital status: Single    Spouse name: Not on file   Number of children: 4   Years of education: Not on file   Highest education level: GED or equivalent  Occupational History    Comment: works at [employer] Use   Smoking status: Never   Smokeless tobacco: Never  Vaping Use   Vaping Use: Never used  Substance and Sexual Activity   Alcohol use: Never   Drug use: Never   Sexual activity: Yes    Comment: has one partner  Other Topics Concern   Not on file  Social History Narrative   Not on file   Social Determinants of Health   Financial Resource Strain: Not on file  Food Insecurity: Not on file  Transportation Needs: Not on file  Physical Activity: Not on file  Stress: Not on file  Social Connections: Not on file  Intimate Partner Violence: Not on file    ROS Review of Systems  Constitutional: Negative.  Negative for activity change, appetite  change, chills, diaphoresis, fatigue and fever.  HENT: Negative.  Negative for congestion, dental problem, drooling, ear discharge, ear pain, facial swelling, hearing loss and mouth sores.   Eyes:  Negative for photophobia, pain, discharge, redness, itching and visual disturbance.       Wears prescription glasses.   Respiratory:  Negative for apnea, cough, choking, chest tightness, shortness of breath, wheezing and stridor.   Cardiovascular: Negative.  Negative for chest pain, palpitations and leg swelling.  Gastrointestinal:  Negative for abdominal distention, abdominal pain, anal bleeding, blood in stool, constipation and diarrhea.  Endocrine: Negative.  Negative for cold intolerance and heat intolerance.  Genitourinary:  Negative for difficulty urinating, dyspareunia, dysuria, enuresis, flank pain, genital sores and urgency.  Musculoskeletal:  Negative for arthralgias, back pain, gait problem and joint swelling.  Skin:  Negative for color change, pallor, rash and wound.  Allergic/Immunologic: Negative.  Negative for environmental allergies, food allergies and immunocompromised state.  Neurological: Negative.  Negative for dizziness, seizures, facial asymmetry, speech difficulty, light-headedness, numbness and headaches.  Hematological: Negative.  Negative for adenopathy. Does not bruise/bleed easily.  Psychiatric/Behavioral:  Negative for agitation, behavioral problems and confusion. The patient is not nervous/anxious.    Objective:   Today's Vitals: BP 106/70 (BP Location: Left Arm, Patient Position: Sitting, Cuff Size: Large)   Pulse Marland Kitchen)  107   Ht 5\' 2"  (1.575 m)   Wt 226 lb 1.9 oz (102.6 kg)   LMP 10/16/2021 (Exact Date)   SpO2 94%   BMI 41.36 kg/m   Physical Exam Constitutional:      General: She is not in acute distress.    Appearance: She is obese. She is not ill-appearing, toxic-appearing or diaphoretic.  HENT:     Left Ear: External ear normal.     Nose: Nose normal. No  congestion or rhinorrhea.     Mouth/Throat:     Mouth: Mucous membranes are moist.     Pharynx: Oropharynx is clear.  Eyes:     General:        Right eye: No discharge.        Left eye: No discharge.     Extraocular Movements: Extraocular movements intact.     Conjunctiva/sclera: Conjunctivae normal.  Neck:     Vascular: No carotid bruit.  Cardiovascular:     Rate and Rhythm: Normal rate.     Pulses: Normal pulses.     Heart sounds: Normal heart sounds. No murmur heard.   No friction rub. No gallop.  Pulmonary:     Effort: No respiratory distress.     Breath sounds: No stridor. No wheezing or rhonchi.  Abdominal:     Palpations: Abdomen is soft. There is no mass.     Tenderness: There is no abdominal tenderness. There is no guarding or rebound.     Hernia: No hernia is present.  Musculoskeletal:        General: No swelling, tenderness, deformity or signs of injury.     Cervical back: Normal range of motion and neck supple. No rigidity or tenderness.     Right lower leg: No edema.     Left lower leg: No edema.  Lymphadenopathy:     Cervical: No cervical adenopathy.  Skin:    General: Skin is dry.     Capillary Refill: Capillary refill takes less than 2 seconds.  Neurological:     General: No focal deficit present.     Mental Status: She is alert and oriented to person, place, and time.     Cranial Nerves: No cranial nerve deficit.     Sensory: No sensory deficit.     Motor: No weakness.     Coordination: Coordination normal.  Psychiatric:        Mood and Affect: Mood normal.        Behavior: Behavior normal.        Thought Content: Thought content normal.    Assessment & Plan:   Problem List Items Addressed This Visit   None Visit Diagnoses     Encounter for annual physical exam    -  Primary   Relevant Orders   HIV antibody (with reflex)   Hepatitis C Antibody   Lipid Profile   CMP14+EGFR   HgB A1c   Vitamin D (25 hydroxy)   TSH       Outpatient  Encounter Medications as of 11/03/2021  Medication Sig   [DISCONTINUED] azithromycin (ZITHROMAX) 250 MG tablet 1 po daily   [DISCONTINUED] cephALEXin (KEFLEX) 500 MG capsule Take 1 capsule (500 mg total) by mouth 4 (four) times daily.   [DISCONTINUED] HYDROcodone-homatropine (HYCODAN) 5-1.5 MG/5ML syrup Take 5 mLs by mouth every 6 (six) hours as needed.   No facility-administered encounter medications on file as of 11/03/2021.    Follow-up: Return in about 2 weeks (around 11/17/2021).  Renee Rival, FNP

## 2021-11-22 ENCOUNTER — Encounter: Payer: Medicaid Other | Admitting: Nurse Practitioner

## 2021-11-25 ENCOUNTER — Ambulatory Visit: Payer: Medicaid Other | Admitting: Nurse Practitioner

## 2022-04-26 ENCOUNTER — Encounter: Payer: Self-pay | Admitting: Nurse Practitioner

## 2022-04-28 NOTE — Telephone Encounter (Signed)
Left voicemail to call office to schedule this appt. ?

## 2023-01-02 DIAGNOSIS — Z23 Encounter for immunization: Secondary | ICD-10-CM | POA: Diagnosis not present

## 2023-01-02 DIAGNOSIS — Z0489 Encounter for examination and observation for other specified reasons: Secondary | ICD-10-CM | POA: Diagnosis not present

## 2023-04-10 DIAGNOSIS — M5431 Sciatica, right side: Secondary | ICD-10-CM | POA: Diagnosis not present

## 2023-04-10 DIAGNOSIS — M79661 Pain in right lower leg: Secondary | ICD-10-CM | POA: Diagnosis not present

## 2023-08-02 ENCOUNTER — Telehealth: Payer: Medicaid Other | Admitting: Physician Assistant

## 2023-08-02 DIAGNOSIS — A6004 Herpesviral vulvovaginitis: Secondary | ICD-10-CM | POA: Diagnosis not present

## 2023-08-02 MED ORDER — VALACYCLOVIR HCL 500 MG PO TABS: 500.0000 mg | ORAL_TABLET | Freq: Two times a day (BID) | ORAL | 0 refills | Status: AC

## 2023-08-02 NOTE — Progress Notes (Signed)
E-Visit for Herpes Simplex  We are sorry that you are not feeling well.  Here is how we plan to help!  Based on what you have shared ith me, it looks like you may be having an outbreak/flare-up of genital herpes.    I have prescribed I have prescribed Valacyclovir 500 mg Take one by mouth twice a day for 3 days.    If you have been prescribed long term medications to be taken on a regular basis, it is important to follow the recommendations and take them as ordered.    Outbreaks usually include blisters and open sores in the genital area. Outbreaks that happen after the first time are usually not as severe and do not last as long. Genital Herpes Simplex is a commonly sexually transmitted viral infection that is found worldwide. Most of these genital infections are caused by one or two herpes simplex viruses that is passed from person to person during vaginal, oral, or anal sex. Sometimes, people do not know they have herpes because they do not have any symptoms.  Please be aware that if you have genital herpes you can be contagious even when you are not having rash or flare-up and you may not have any symptoms, even when you are taking suppressive medicines.  Herpes cannot be cured. The disease usually causes most problems during the first few years. After that, the virus is still there, but it causes few to no symptoms. Even when the virus is active, people with herpes can take medicines to reduce and help prevent symptoms.  Herpes is an infection that can cause blisters and open sores on the genital area. Herpes is caused by a virus that is passed from person to person during vaginal, oral, or anal sex. Sometimes, people do not know they have herpes because they do not have any symptoms. Herpes cannot be cured. The disease usually causes most problems during the first few years. After that, the virus is still there, but it causes few to no symptoms. Even when the virus is active, people with herpes  can take medicines to reduce and help prevent symptoms.  If you have been prescribed medications to be taken on a regular basis, it is important to follow the recommendations and take them as ordered.  Some people with herpes never have any symptoms. But other people can develop symptoms within a few weeks of being infected with the herpes virus   Symptoms usually include blisters in the genital area. In women, this area includes the vagina, buttocks, anus, or thighs. In men, this area includes the penis, scrotum, anus, butt, or thighs. The blisters can become painful open sores, which then crust over as they heal. Sometimes, people can have other symptoms that include:  ?Blisters on the mouth or lips ?Fever, headache, or pain in the joints ?Trouble urinating  Outbreaks might occur every month or more often, or just once or twice a year. Sometimes, people can tell when an outbreak will occur, because they feel itching or pain beforehand. Sometimes they do not know that an outbreak is coming because they have no symptoms. Whatever your pattern is, keep in mind that herpes outbreaks usually become less frequent over time as you get older. Certain things, called "triggers," can make outbreaks more likely to occur. These include stress, sunlight, menstrual periods,or getting sick.  Antiviral therapy can shorten the duration of symptoms and signs in primary infection, which, when untreated, can be associated with significant increase in the   symptoms of the disease.  HOME CARE Use a portable bath (such as a "Sitz bath") where you can sit in warm water for about 20 minutes. Your bathtub could also work. Avoid bubble baths.  Keep the genital area clean and dry and avoid tight clothes.  Take over-the-counter pain medicine such as acetaminophen (brand name: Tylenol) or ibuprofen sample brand names: Advil, Motrin). But avoid aspirin.  Only take medications as instructed by your medical team.  You are  most likely to spread herpes to a sex partner when you have blisters and open sores on your body. But it's also possible to spread herpes to your partner when you do not have any symptoms. That is because herpes can be present on your body without causing any symptoms, like blisters or pain.  Telling your sex partner that you have herpes can be hard. But it can help protect them, since there are ways to lower the risk of spreading the infection.   Using a condom every time you have sex  Not having sex when you have symptoms  Not having oral sex if you have blisters or open sores (in the genital area or around your mouth)  MAKE SURE YOU   Understand these instructions. Do not have sex without using a condom until you have been seen by a doctor and as instructed by the provider If you are not better or improved within 7 days, you MUST have a follow up at your doctor or the health department for evaluation. There are other causes of rashes in the genital region.  Thank you for choosing an e-visit.  Your e-visit answers were reviewed by a board certified advanced clinical practitioner to complete your personal care plan. Depending upon the condition, your plan could have included both over the counter or prescription medications.  Please review your pharmacy choice. Make sure the pharmacy is open so you can pick up prescription now. If there is a problem, you may contact your provider through MyChart messaging and have the prescription routed to another pharmacy.  Your safety is important to us. If you have drug allergies check your prescription carefully.   For the next 24 hours you can use MyChart to ask questions about today's visit, request a non-urgent call back, or ask for a work or school excuse. You will get an email in the next two days asking about your experience. I hope that your e-visit has been valuable and will speed your recovery.   I have spent 5 minutes in review of e-visit  questionnaire, review and updating patient chart, medical decision making and response to patient.   Jennifer M Burnette, PA-C  

## 2023-08-08 DIAGNOSIS — U071 COVID-19: Secondary | ICD-10-CM | POA: Diagnosis not present

## 2024-02-17 DIAGNOSIS — R109 Unspecified abdominal pain: Secondary | ICD-10-CM | POA: Diagnosis not present

## 2024-02-17 DIAGNOSIS — R1013 Epigastric pain: Secondary | ICD-10-CM | POA: Diagnosis not present

## 2024-02-17 DIAGNOSIS — R112 Nausea with vomiting, unspecified: Secondary | ICD-10-CM | POA: Diagnosis not present

## 2024-02-17 DIAGNOSIS — R1011 Right upper quadrant pain: Secondary | ICD-10-CM | POA: Diagnosis not present

## 2024-02-17 DIAGNOSIS — N3000 Acute cystitis without hematuria: Secondary | ICD-10-CM | POA: Diagnosis not present

## 2024-02-19 ENCOUNTER — Telehealth: Payer: Self-pay

## 2024-02-19 NOTE — Transitions of Care (Post Inpatient/ED Visit) (Signed)
   02/19/2024  Name: Donna Horton MRN: [id number] DOB: [birth year]  Today's TOC FU Call Status: Today's TOC FU Call Status:: Unsuccessful Call (1st Attempt) Unsuccessful Call (1st Attempt) Date: 02/19/24  Attempted to reach the patient regarding the most recent Inpatient/ED visit.  Follow Up Plan: Additional outreach attempts will be made to reach the patient to complete the Transitions of Care (Post Inpatient/ED visit) call.   Signature  [employer], [place]
